# Patient Record
Sex: Female | Born: 1955 | Race: Black or African American | Hispanic: No | Marital: Married | Smoking: Never smoker
Health system: Southern US, Community
[De-identification: ages and names within clinical notes are randomized; demographics above are authoritative.]

## PROBLEM LIST (undated history)

## (undated) HISTORY — PX: ABLATION: SHX5711

---

## 2021-05-11 ENCOUNTER — Emergency Department (HOSPITAL_COMMUNITY)
Admission: EM | Admit: 2021-05-11 | Discharge: 2021-05-12 | Disposition: A | Discharge status: Home / Self Care | Attending: Emergency Medicine | Admitting: Emergency Medicine

## 2021-05-11 ENCOUNTER — Encounter (HOSPITAL_COMMUNITY): Payer: Self-pay | Admitting: Emergency Medicine

## 2021-05-11 ENCOUNTER — Other Ambulatory Visit: Payer: Self-pay

## 2021-05-11 DIAGNOSIS — R002 Palpitations: Secondary | ICD-10-CM

## 2021-05-11 DIAGNOSIS — M79602 Pain in left arm: Secondary | ICD-10-CM | POA: Insufficient documentation

## 2021-05-11 DIAGNOSIS — I493 Ventricular premature depolarization: Secondary | ICD-10-CM

## 2021-05-11 NOTE — ED Triage Notes (Signed)
Pt reports palpitations at home up to 135 HR tonight about 2 hours ago. Pt took a PRN metoprolol with some relief. Pt reports some left sided chest pain and left arm pain 4/10. Pt reports recently starting Crestor. Pt denies any other sx. Hx of cardiac ablations. ST with PVCs at rate 100 noted in triage.

## 2021-05-11 NOTE — ED Provider Notes (Signed)
Emergency Medicine Provider Triage Evaluation Note  Lynn Nielsen , a 65 y.o. female  was evaluated in triage.  Pt complains of tachycardia and left arm pain.  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  HR today got up to about 140 which it has not done in a while.  Took home metoprolol which seemed to reduce rate.  Some mild pain along left lateral chest and left.  Re-started on crestor recently which has caused issues in the past.  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  Review of Systems  Positive: Palpitations, left arm pain Negative: Vomiting, sweating  Physical Exam  BP (!) 165/95 (BP Location: Left Arm)   Pulse (!) 116   Temp 98.5 F (36.9 C) (Oral)   Resp 18   Ht 5\' 6"  (1.676 m)   Wt 93 kg   SpO2 98%   BMI 33.09 kg/m  ' Gen:   Awake, no distress   Resp:  Normal effort  MSK:   Moves extremities without difficulty  Other:    Medical Decision Making  Medically screening exam initiated at 11:36 PM.  Appropriate orders placed.  Lynn Nielsen was informed that the remainder of the evaluation will be completed by another provider, this initial triage assessment does not replace that evaluation, and the importance of remaining in the ED until their evaluation is complete.  HR current 116 in triage.  States still feeling some palpitations.  Will obtain labs, EKG, CXR.   Satira Sark, PA-C 05/11/21 2357    05/13/21, MD 05/12/21 [phone]

## 2021-05-12 ENCOUNTER — Emergency Department (HOSPITAL_COMMUNITY)

## 2021-05-12 LAB — CBC WITH DIFFERENTIAL/PLATELET
Abs Immature Granulocytes: 0.03 10*3/uL (ref 0.00–0.07)
Basophils Absolute: 0.1 10*3/uL (ref 0.0–0.1)
Basophils Relative: 1 %
Eosinophils Absolute: 0.3 10*3/uL (ref 0.0–0.5)
Eosinophils Relative: 4 %
HCT: 37.1 % (ref 36.0–46.0)
Hemoglobin: 11.9 g/dL — ABNORMAL LOW (ref 12.0–15.0)
Immature Granulocytes: 0 %
Lymphocytes Relative: 40 %
Lymphs Abs: 3.5 10*3/uL (ref 0.7–4.0)
MCH: 26.6 pg (ref 26.0–34.0)
MCHC: 32.1 g/dL (ref 30.0–36.0)
MCV: 82.8 fL (ref 80.0–100.0)
Monocytes Absolute: 0.8 10*3/uL (ref 0.1–1.0)
Monocytes Relative: 9 %
Neutro Abs: 4 10*3/uL (ref 1.7–7.7)
Neutrophils Relative %: 46 %
Platelets: 246 10*3/uL (ref 150–400)
RBC: 4.48 MIL/uL (ref 3.87–5.11)
RDW: 14.1 % (ref 11.5–15.5)
WBC: 8.8 10*3/uL (ref 4.0–10.5)
nRBC: 0 % (ref 0.0–0.2)

## 2021-05-12 LAB — BASIC METABOLIC PANEL
Anion gap: 9 (ref 5–15)
BUN: 17 mg/dL (ref 8–23)
CO2: 26 mmol/L (ref 22–32)
Calcium: 9.3 mg/dL (ref 8.9–10.3)
Chloride: 104 mmol/L (ref 98–111)
Creatinine, Ser: 0.76 mg/dL (ref 0.44–1.00)
GFR, Estimated: >60 mL/min (ref >60)
Glucose, Bld: 156 mg/dL — ABNORMAL HIGH (ref 70–99)
Potassium: 3.4 mmol/L — ABNORMAL LOW (ref 3.5–5.1)
Sodium: 139 mmol/L (ref 135–145)

## 2021-05-12 LAB — TROPONIN I (HIGH SENSITIVITY)
Troponin I (High Sensitivity): 4 ng/L (ref <18)
Troponin I (High Sensitivity): 5 ng/L (ref <18)

## 2021-05-12 LAB — MAGNESIUM: Magnesium: 1.9 mg/dL (ref 1.7–2.4)

## 2021-05-12 NOTE — ED Notes (Signed)
Pt transported to xray 

## 2021-05-12 NOTE — ED Provider Notes (Signed)
[facility] EMERGENCY DEPARTMENT Provider Note   CSN: [id number] Arrival date & time: 05/11/21  2330     History Chief Complaint  Patient presents with   Tachycardia    Lynn Nielsen is a 65 y.o. female.  The history is provided by the patient.  Palpitations Palpitations quality:  Fast Onset quality:  Sudden Timing:  Constant Progression:  Improving Chronicity:  New Relieved by: Metoprolol. Worsened by:  Nothing Associated symptoms: shortness of breath   Associated symptoms: no chest pain, no lower extremity edema, no syncope and no vomiting   Associated symptoms comment:  Left arm pain Patient presents with palpitations, tachycardia and left arm pain. Patient is from [place] and is visiting here for a conference.  Patient has a previous history of PVC induced V. fib with syncope.  She has had 2 separate ablations, last one was February 2022. Earlier in the evening she had onset of tachycardia up to the 130-140s.  She took 25 mg of metoprolol and is seeming to improve. She has not been on metoprolol for the past month She is now improving.  No active chest pain.  She did have left arm pain and shortness of breath.  No syncope She reports recently starting Crestor and thought this may be causing symptoms, but has stopped it since   No excessive caffeine use.  No drug abuse  PMH - PVCs Past Surgical History:  Procedure Laterality Date   ABLATION       OB History   No obstetric history on file.     No family history on file.  Social History   Tobacco Use   Smoking status: Never   Smokeless tobacco: Never  Substance Use Topics   Alcohol use: Never   Drug use: Never    Home Medications Prior to Admission medications   Not on File    Allergies    Iodine, Penicillins, Skelaxin [metaxalone], and Sulfa antibiotics  Review of Systems   Review of Systems  Respiratory:  Positive for shortness of breath.   Cardiovascular:  Positive for  palpitations. Negative for chest pain and syncope.  Gastrointestinal:  Negative for vomiting.  Neurological:  Negative for syncope.  All other systems reviewed and are negative.  Physical Exam Updated Vital Signs BP 120/74   Pulse 70   Temp 98.5 F (36.9 C) (Oral)   Resp 17   Ht 1.676 m (5\' 6" )   Wt 93 kg   SpO2 98%   BMI 33.09 kg/m   Physical Exam CONSTITUTIONAL: Well developed/well nourished HEAD: Normocephalic/atraumatic EYES: EOMI/PERRL ENMT: Mucous membranes moist NECK: supple no meningeal signs SPINE/BACK:entire spine nontender CV: S1/S2 noted, no murmurs/rubs/gallops noted LUNGS: Lungs are clear to auscultation bilaterally, no apparent distress ABDOMEN: soft, nontender, no rebound or guarding, bowel sounds noted throughout abdomen GU:no cva tenderness NEURO: Pt is awake/alert/appropriate, moves all extremitiesx4.  No facial droop.   EXTREMITIES: pulses normal/equal, full ROM, no calf tenderness or edema No deformities SKIN: warm, color normal PSYCH: no abnormalities of mood noted, alert and oriented to situation  ED Results / Procedures / Treatments   Labs (all labs ordered are listed, but only abnormal results are displayed) Labs Reviewed  CBC WITH DIFFERENTIAL/PLATELET - Abnormal; Notable for the following components:      Result Value   Hemoglobin 11.9 (*)    All other components within normal limits  BASIC METABOLIC PANEL - Abnormal; Notable for the following components:   Potassium 3.4 (*)  Glucose, Bld 156 (*)    All other components within normal limits  MAGNESIUM  TROPONIN I (HIGH SENSITIVITY)  TROPONIN I (HIGH SENSITIVITY)    EKG EKG Interpretation  Date/Time:  Wednesday May 11 2021 23:35:34 EDT Ventricular Rate:  110 PR Interval:  148 QRS Duration: 106 QT Interval:  326 QTC Calculation: 441 R Axis:   77 Text Interpretation: Sinus tachycardia with occasional Premature ventricular complexes Anterior infarct , age undetermined Abnormal  ECG Confirmed by Zadie Rhine ([id number]) on 05/12/2021 12:33:21 AM  Radiology DG Chest 2 View  Result Date: 05/12/2021 CLINICAL DATA:  Cardiac palpitations EXAM: CHEST - 2 VIEW COMPARISON:  None. FINDINGS: The heart size and mediastinal contours are within normal limits. Both lungs are clear. The visualized skeletal structures are unremarkable. IMPRESSION: No active cardiopulmonary disease. Electronically Signed   By: Alcide Clever M.D.   On: 05/12/2021 00:44    Procedures Procedures   Medications Ordered in ED Medications - No data to display  ED Course  I have reviewed the triage vital signs and the nursing notes.  Pertinent labs & imaging results that were available during my care of the patient were reviewed by me and considered in my medical decision making (see chart for details).    MDM Rules/Calculators/A&P                          4:20 AM Patient with previous history of PVC induced syncope and has had 2 ablations presents with palpitations. Earlier in the night she started having tachycardia and took a home dose of metoprolol and is now improving.  No syncope.  Labs are overall reassuring.  While in the emergency department her heart rate is steadily improved.  She is now in the 70s.  She has had an occasional PVC but no other arrhythmias. She will take her daily metoprolol for now, and follow-up with her electrophysiologist when she returns to [place] At this point I feel she is safe for discharge.  Low suspicion for ACS/PE at this time Final Clinical Impression(s) / ED Diagnoses Final diagnoses:  Palpitations  PVC (premature ventricular contraction)    Rx / DC Orders ED Discharge Orders     None        Zadie Rhine, MD 05/12/21 [phone]

## 2021-05-12 NOTE — Discharge Instructions (Addendum)
You can take your metoprolol every day.  Please call your electrophysiologist when you return to [place]. Enjoy your time in [place]!

## 2021-11-22 IMAGING — CR DG CHEST 2V
2 series · 2 of 2 positions shown · non-contrast
Comparison: None.

CLINICAL DATA: Cardiac palpitations

EXAM:
CHEST - 2 VIEW

[chest pa]
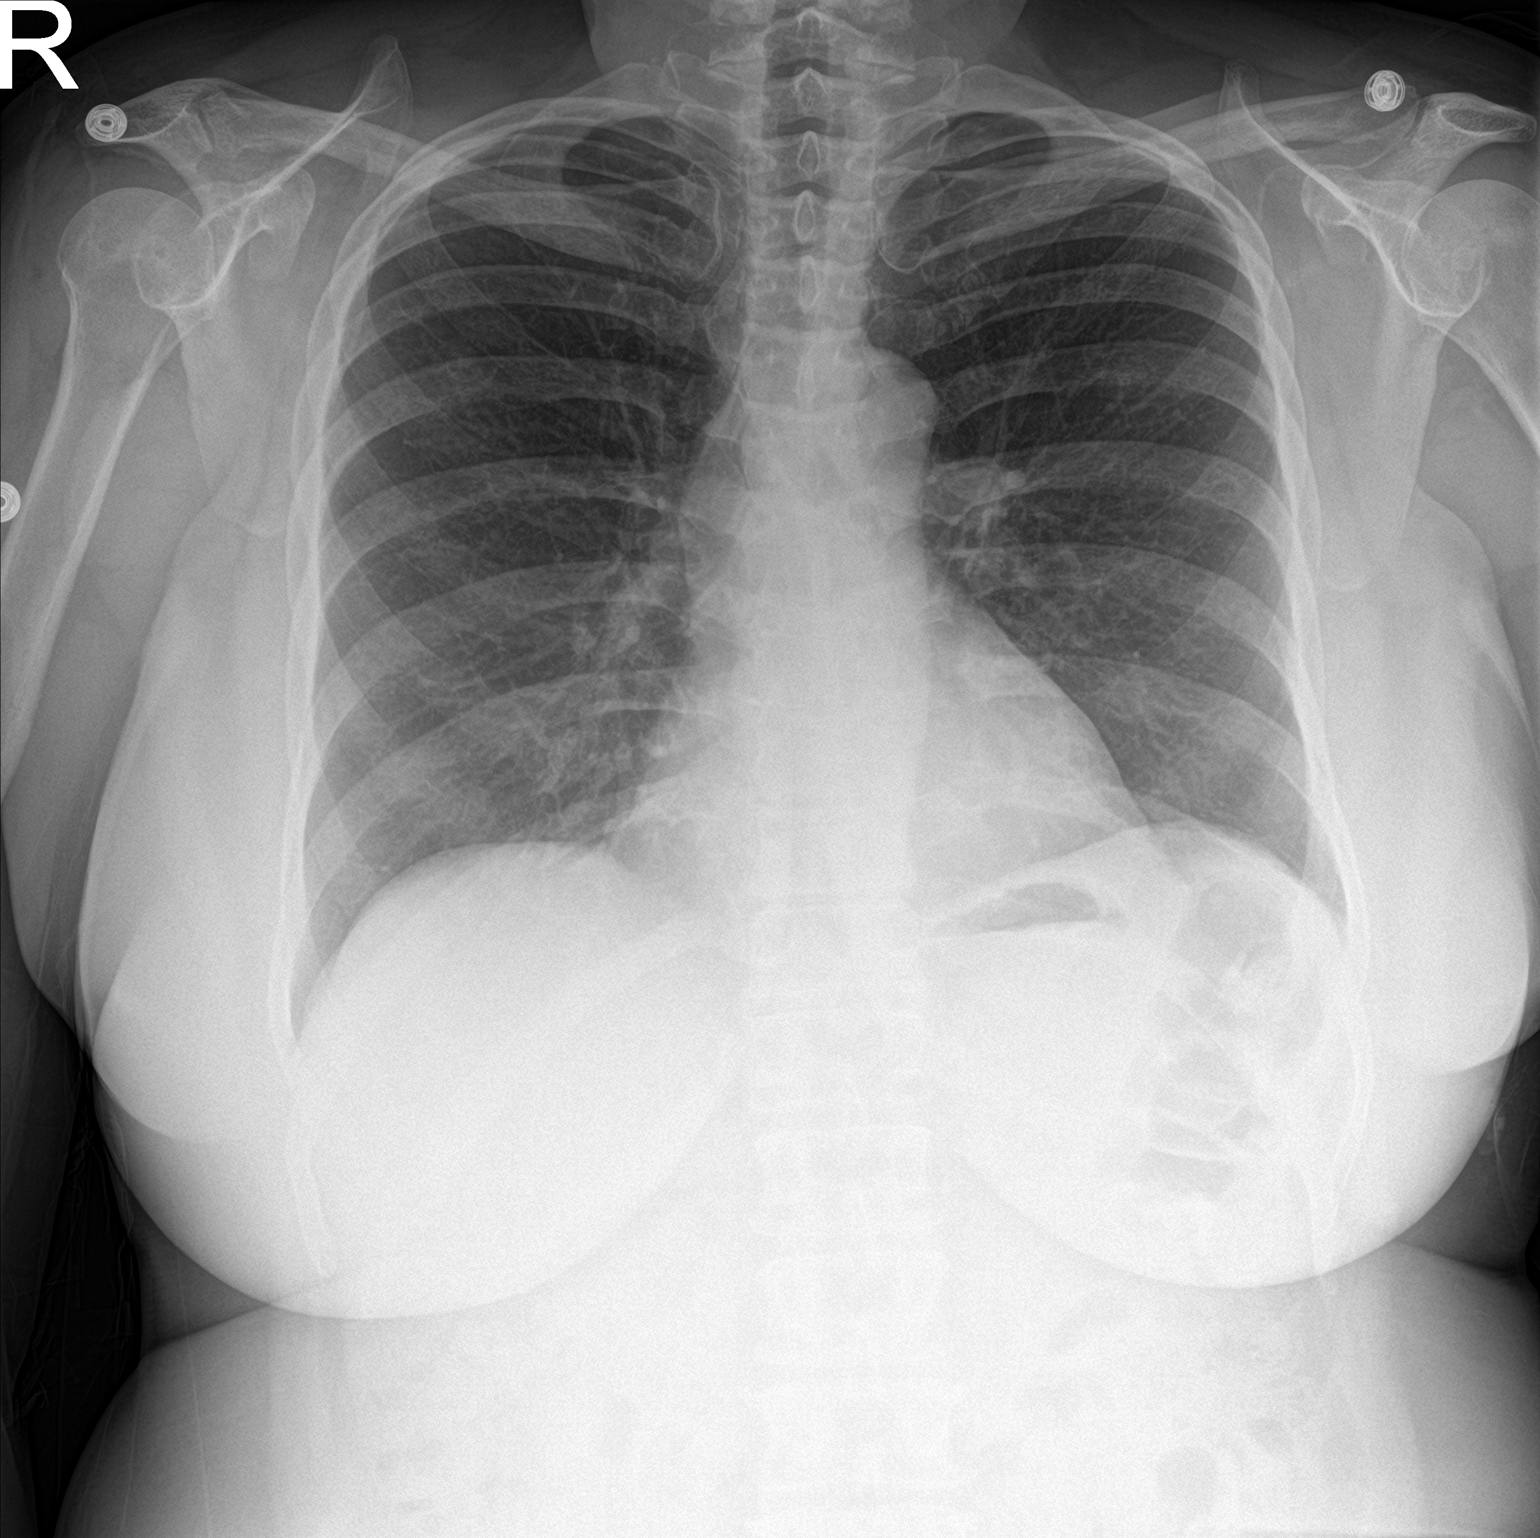

[chest lat]
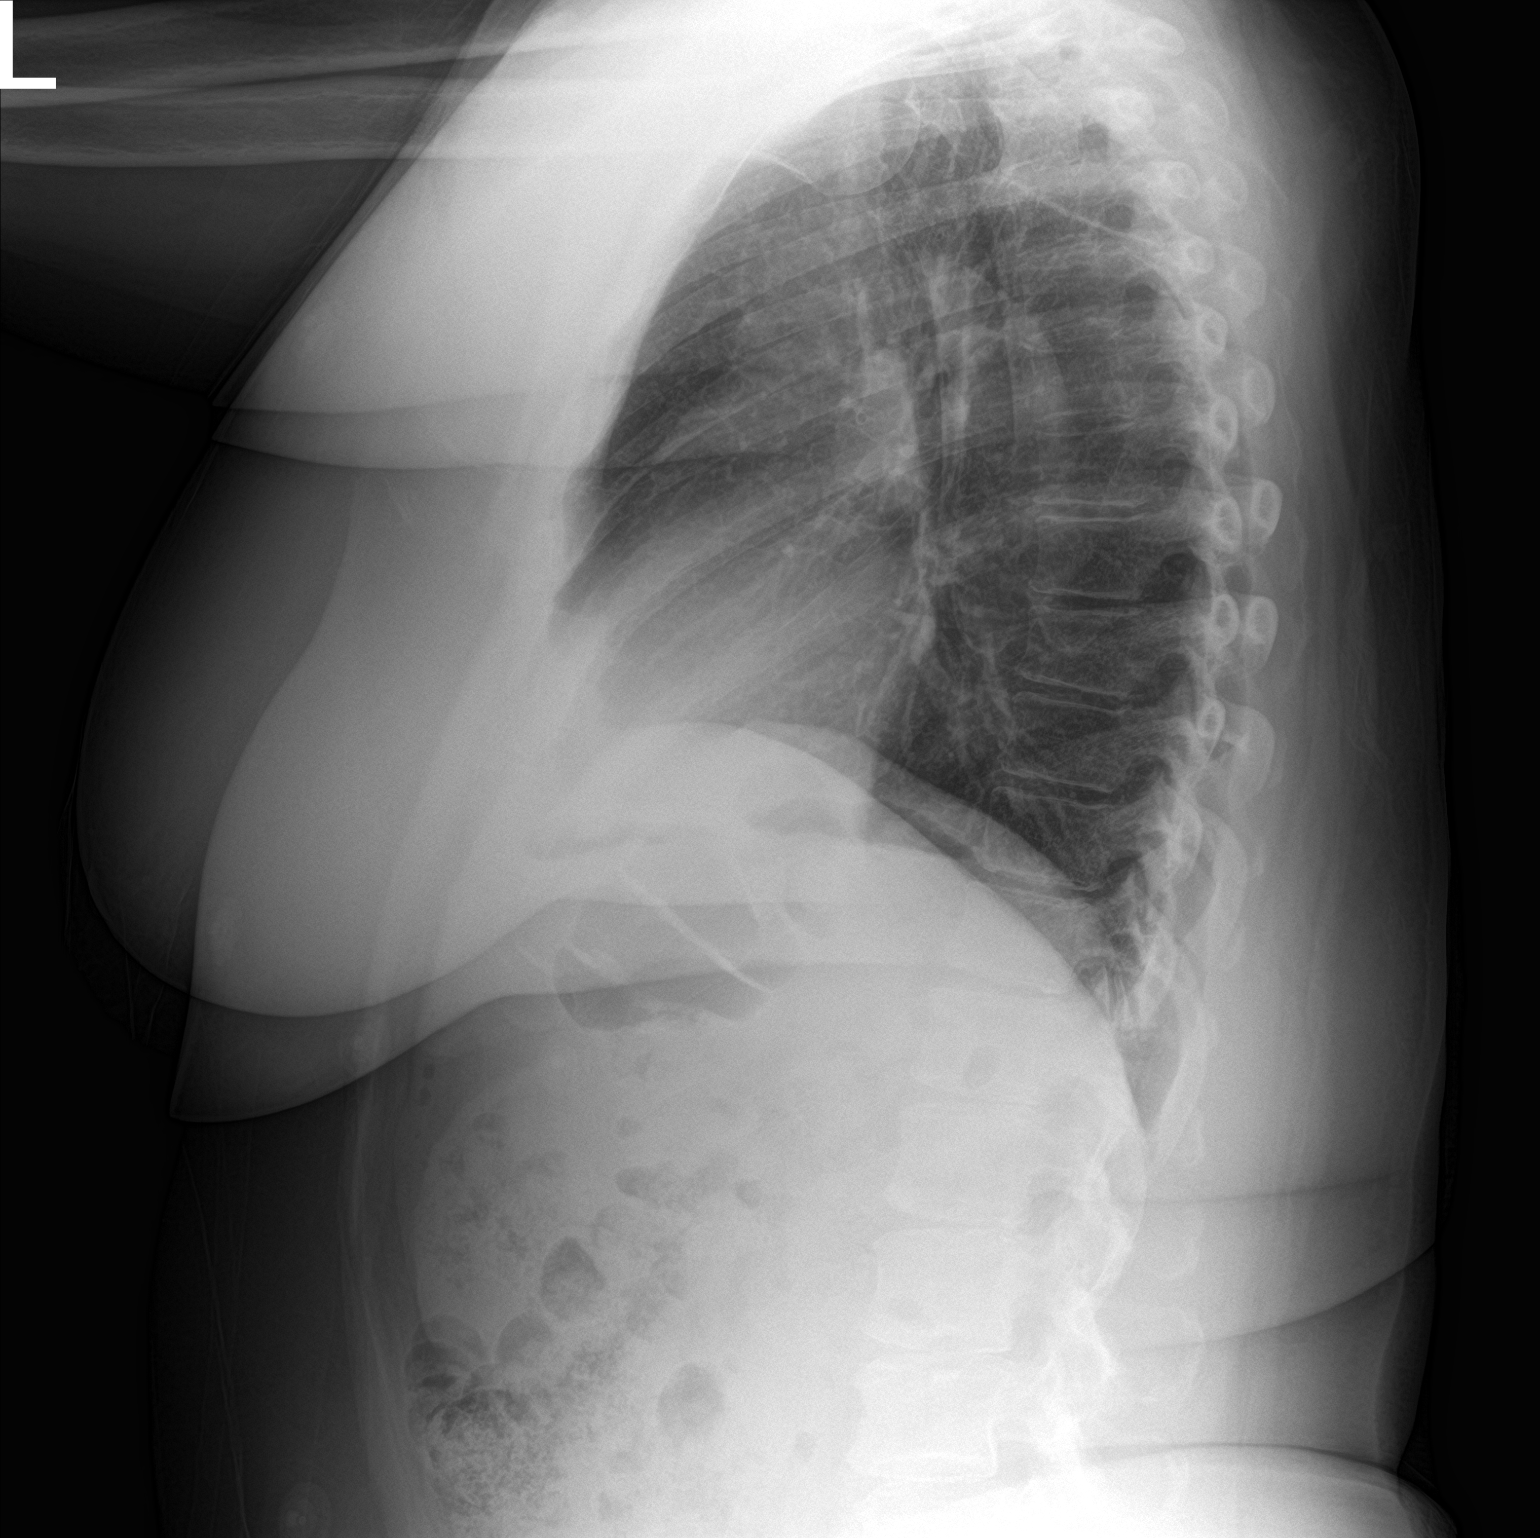

[2 of 2 positions shown; findings below may reference images not displayed]

FINDINGS: The heart size and mediastinal contours are within normal limits.
Both lungs are clear. The visualized skeletal structures are
unremarkable.
IMPRESSION: No active cardiopulmonary disease.
